# Patient Record
Sex: Male | Born: 2000 | Race: White | Hispanic: No | Marital: Single | Smoking: Never smoker
Health system: Southern US, Community
[De-identification: ages and names within clinical notes are randomized; demographics above are authoritative.]

---
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
## 2022-04-13 ENCOUNTER — Ambulatory Visit: Payer: Self-pay | Admitting: Family Medicine

## 2022-04-13 ENCOUNTER — Encounter: Payer: Self-pay | Admitting: Family Medicine

## 2022-04-13 DIAGNOSIS — Z113 Encounter for screening for infections with a predominantly sexual mode of transmission: Secondary | ICD-10-CM

## 2022-04-13 NOTE — Progress Notes (Signed)
[facility] Department STI clinic/screening visit  Subjective:  Juan Reynolds is a 21 y.o. male being seen today for an STI screening visit. The patient reports they do not have symptoms.    Patient has the following medical conditions:  There are no problems to display for this patient.    Chief Complaint  Patient presents with  . SEXUALLY TRANSMITTED DISEASE    STD screening. No sx    HPI  Patient reports he is here for STD screen.  Desires symptoms.  Does the patient or their partner desires a pregnancy in the next year? No  Screening for MPX risk: Does the patient have an unexplained rash? No Is the patient MSM? No Does the patient endorse multiple sex partners or anonymous sex partners? No Did the patient have close or sexual contact with a person diagnosed with MPX? No Has the patient traveled outside the [place] where MPX is endemic? No Is there a high clinical suspicion for MPX-- evidenced by one of the following No  -Unlikely to be chickenpox  -Lymphadenopathy  -Rash that present in same phase of evolution on any given body part   See flowsheet for further details and programmatic requirements.   Immunization History  Administered Date(s) Administered  . Tdap 02/03/2017     The following portions of the patient's history were reviewed and updated as appropriate: allergies, current medications, past medical history, past social history, past surgical history and problem list.  Objective:  There were no vitals filed for this visit.  Physical Exam Constitutional:      Appearance: Normal appearance.  HENT:     Head: Normocephalic and atraumatic.     Comments: No nits or hair loss    Mouth/Throat:     Mouth: Mucous membranes are moist.     Pharynx: Oropharynx is clear. No oropharyngeal exudate or posterior oropharyngeal erythema.  Pulmonary:     Effort: Pulmonary effort is normal.  Abdominal:     General: Abdomen is flat.     Palpations:  Abdomen is soft. There is no hepatomegaly or mass.     Tenderness: There is no abdominal tenderness.  Genitourinary:    Pubic Area: No rash or pubic lice (no nits).      Penis: Normal. No tenderness, discharge, swelling or lesions.      Testes: Normal.     Epididymis:     Right: Normal.     Left: Normal.  Lymphadenopathy:     Head:     Right side of head: No preauricular or posterior auricular adenopathy.     Left side of head: No preauricular or posterior auricular adenopathy.     Cervical: No cervical adenopathy.     Upper Body:     Right upper body: No supraclavicular, axillary or epitrochlear adenopathy.     Left upper body: No supraclavicular, axillary or epitrochlear adenopathy.     Lower Body: No right inguinal adenopathy. No left inguinal adenopathy.  Skin:    General: Skin is warm and dry.     Findings: No rash.  Neurological:     Mental Status: He is alert and oriented to person, place, and time.      Assessment and Plan:  Juan Reynolds is a 21 y.o. male presenting to the [facility] Department for STI screening  1. Screening examination for venereal disease  - Chlamydia/GC NAA, Confirmation Co to use condoms always for STD prevention.    Return if symptoms worsen or  fail to improve.  No future appointments.  Larene Pickett, FNP

## 2022-04-13 NOTE — Progress Notes (Signed)
STI screening only. Results from urine in 2-3 days and instructed on myChart as well as phone call if any positive results. Reminded on need for password for phones to/ from HD. Pt verbalized understanding. Bwiedenheft RN

## 2022-04-15 LAB — CHLAMYDIA/GC NAA, CONFIRMATION
Chlamydia trachomatis, NAA: NEGATIVE
Neisseria gonorrhoeae, NAA: NEGATIVE
